# Patient Record
Sex: Female | Born: 2000 | Race: White | Hispanic: No | Marital: Single
Health system: Southern US, Community
[De-identification: ages and names within clinical notes are randomized; demographics above are authoritative.]

---
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
## 2019-06-29 IMAGING — US US ART/VEN ABD/PELV/SCROTUM DOPPLER LTD
1 series · 13 of 25 positions shown · non-contrast
Comparison: None.

CLINICAL DATA: Pelvic pain since last night.

EXAM:
TRANSABDOMINAL AND TRANSVAGINAL ULTRASOUND OF PELVIS
DOPPLER ULTRASOUND OF OVARIES
TECHNIQUE: Both transabdominal and transvaginal ultrasound examinations of the
pelvis were performed. Transabdominal technique was performed for
global imaging of the pelvis including uterus, ovaries, adnexal
regions, and pelvic cul-de-sac.
It was necessary to proceed with endovaginal exam following the
transabdominal exam to visualize the uterus and ovaries in better
detail. Color and duplex Doppler ultrasound was utilized to evaluate
blood flow to the ovaries.

[Series 1: us art/ven abd/pelv/scrotum doppler ltd · 0.25mm/px · 13 of 121 slices shown]
[im 1/121]
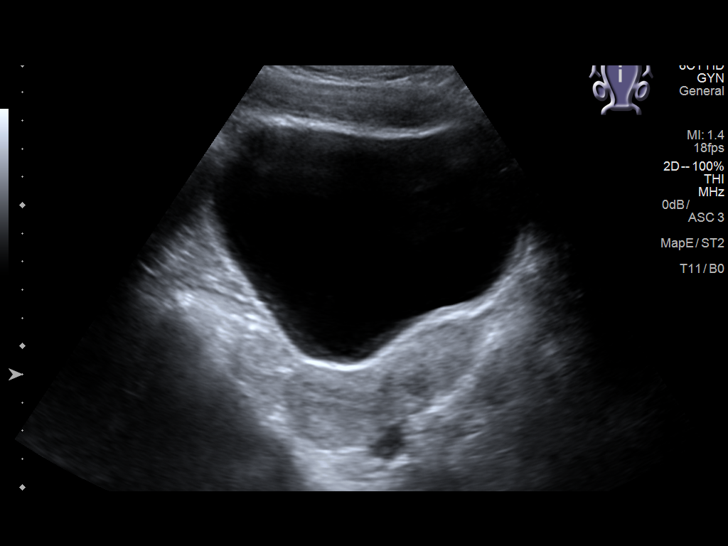
[im 11/121]
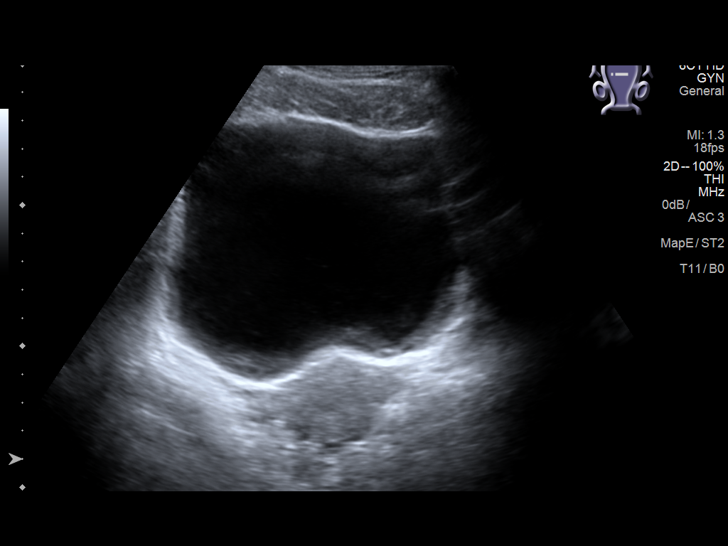
[im 21/121]
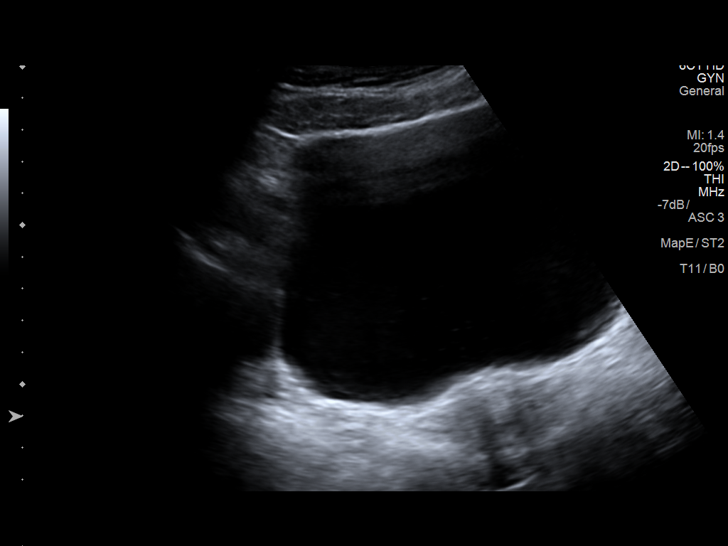
[im 31/121]
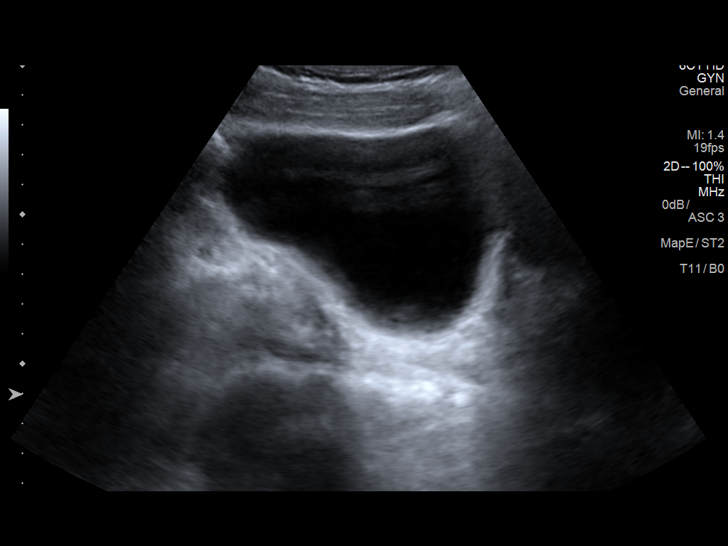
[im 41/121]
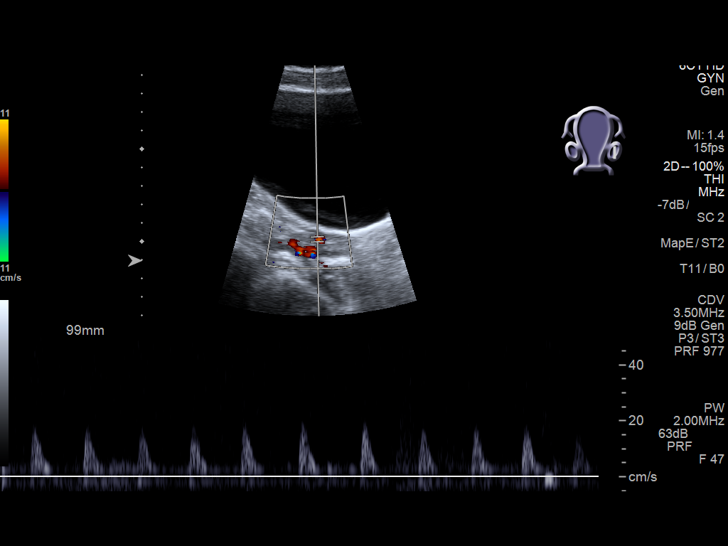
[im 51/121]
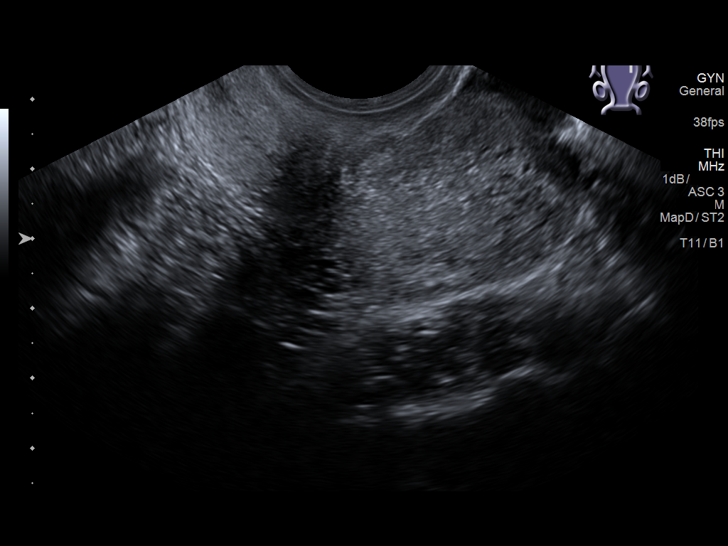
[im 61/121]
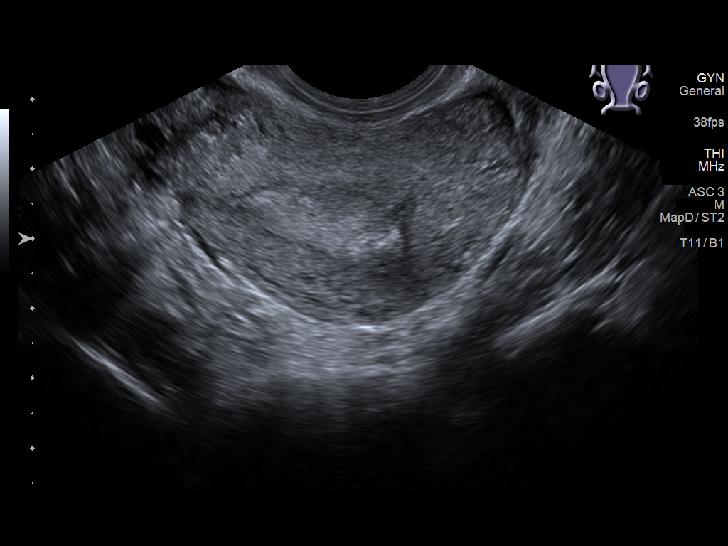
[im 71/121]
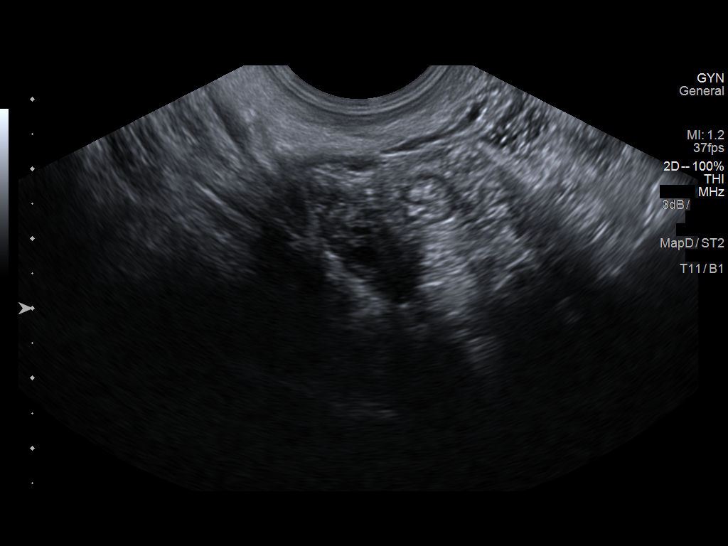
[im 81/121]
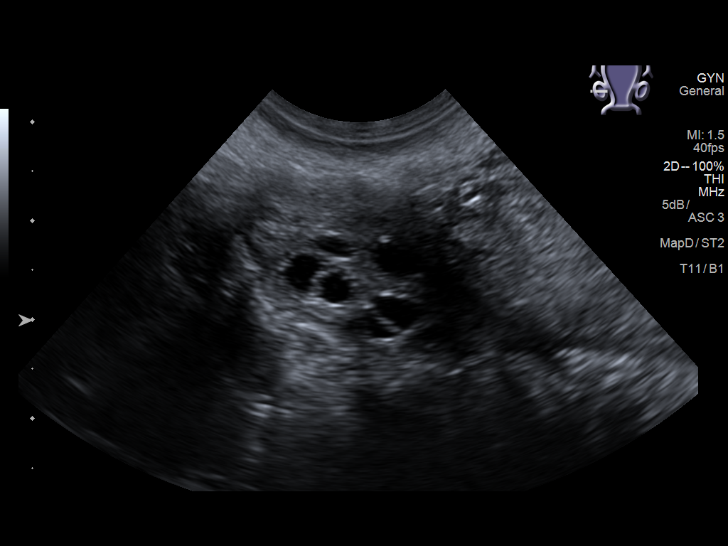
[im 91/121]
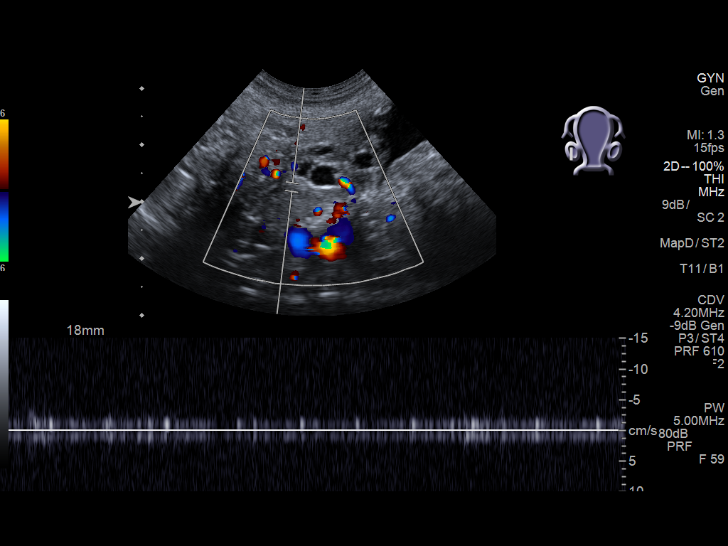
[im 101/121]
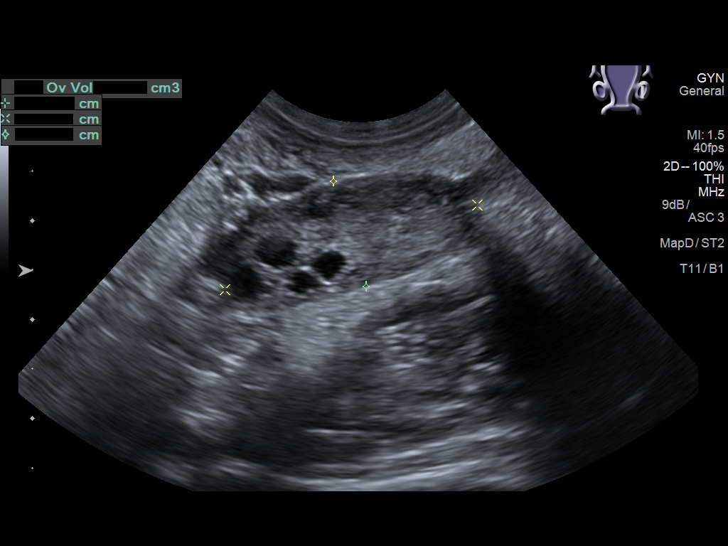
[im 111/121]
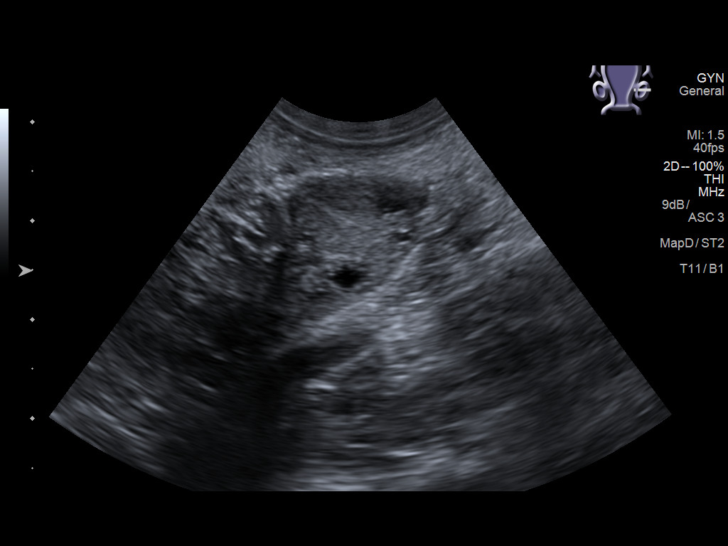
[im 121/121]
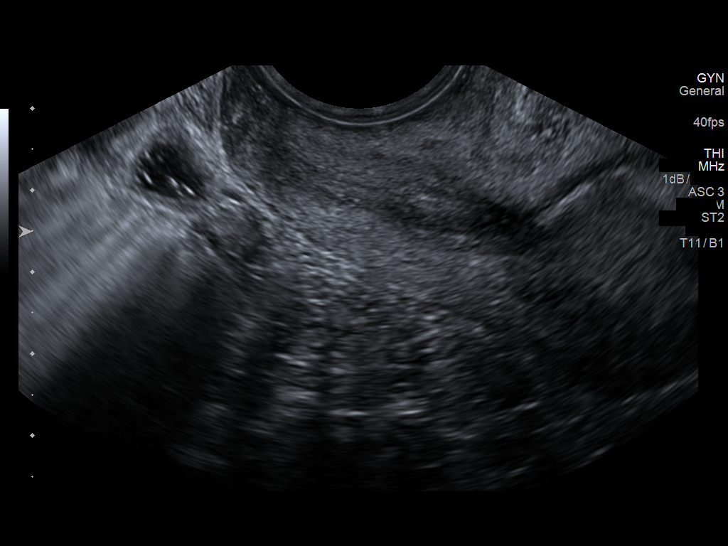

[13 of 25 positions shown; findings below may reference images not displayed]

FINDINGS: Uterus

Measurements: 7.0 x 5.0 x 3.9 cm. No fibroids or other mass
visualized.

Endometrium

Thickness: 5.1 mm.  No focal abnormality visualized.

Right ovary

Measurements: 3.0 x 1.7 x 1.2 cm. Normal appearance/no adnexal mass.

Left ovary

Measurements: 2.7 x 1.4 x 1.1 cm. Normal appearance/no adnexal mass.

Pulsed Doppler evaluation of both ovaries demonstrates normal
low-resistance arterial and venous waveforms.

Other findings

Small amount of free peritoneal fluid, within normal limits of
physiological fluid.
IMPRESSION: Normal examination.
